# Patient Record
Sex: Male | Born: 1990 | Race: Black or African American | Hispanic: No | Marital: Single | Smoking: Never smoker
Health system: Southern US, Community
[De-identification: ages and names within clinical notes are randomized; demographics above are authoritative.]

---
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
## 2020-04-13 ENCOUNTER — Emergency Department (HOSPITAL_BASED_OUTPATIENT_CLINIC_OR_DEPARTMENT_OTHER): Payer: Managed Care, Other (non HMO)

## 2020-04-13 ENCOUNTER — Encounter (HOSPITAL_BASED_OUTPATIENT_CLINIC_OR_DEPARTMENT_OTHER): Payer: Self-pay | Admitting: *Deleted

## 2020-04-13 ENCOUNTER — Emergency Department (HOSPITAL_BASED_OUTPATIENT_CLINIC_OR_DEPARTMENT_OTHER)
Admission: EM | Admit: 2020-04-13 | Discharge: 2020-04-13 | Disposition: A | Discharge status: Home / Self Care | Payer: Managed Care, Other (non HMO) | Attending: Emergency Medicine | Admitting: Emergency Medicine

## 2020-04-13 ENCOUNTER — Other Ambulatory Visit: Payer: Self-pay

## 2020-04-13 DIAGNOSIS — J069 Acute upper respiratory infection, unspecified: Secondary | ICD-10-CM

## 2020-04-13 DIAGNOSIS — R05 Cough: Secondary | ICD-10-CM | POA: Diagnosis present

## 2020-04-13 DIAGNOSIS — Z20822 Contact with and (suspected) exposure to covid-19: Secondary | ICD-10-CM | POA: Diagnosis not present

## 2020-04-13 LAB — SARS CORONAVIRUS 2 BY RT PCR (HOSPITAL ORDER, PERFORMED IN ~~LOC~~ HOSPITAL LAB): SARS Coronavirus 2: NEGATIVE

## 2020-04-13 NOTE — ED Triage Notes (Signed)
c/o URI  symptoms x 3 days , including chills,body aches , cough

## 2020-04-13 NOTE — ED Provider Notes (Signed)
[facility] EMERGENCY DEPARTMENT Provider Note   CSN: [id number] Arrival date & time: 04/13/20  1556     History Chief Complaint  Patient presents with  . URI    Bryan Rhodes is a 29 y.o. male.  HPI 29 year old male with body aches, dry nonproductive cough, sore throat, body aches which started 2 days ago.  He has not been vaccinated for Covid, no known Covid exposures.  Denies any fevers or chills.  Denies any chest pain or shortness of breath, though he states he felt some pain in his chest when the chest x-ray came by.  He denies any shortness of breath with walking.  No abdominal pain, nausea or vomiting.  No dysuria or hematuria.  No recent tick exposures.    History reviewed. No pertinent past medical history.  There are no problems to display for this patient.   History reviewed. No pertinent surgical history.     No family history on file.  Social History   Tobacco Use  . Smoking status: Never Smoker  . Smokeless tobacco: Never Used  Substance Use Topics  . Alcohol use: Not Currently  . Drug use: Not Currently    Home Medications Prior to Admission medications   Not on File    Allergies    Patient has no known allergies.  Review of Systems   Review of Systems  Constitutional: Negative for chills and fever.  HENT: Positive for sore throat. Negative for sinus pressure, trouble swallowing and voice change.   Respiratory: Positive for cough. Negative for shortness of breath.   Cardiovascular: Negative for chest pain.  Gastrointestinal: Negative for abdominal pain, nausea and vomiting.  Genitourinary: Negative for dysuria and hematuria.  Musculoskeletal: Positive for myalgias.  Neurological: Negative for headaches.    Physical Exam Updated Vital Signs BP (!) 144/93   Pulse (!) 101   Temp 98.9 F (37.2 C)   Resp 18   Ht 5\' 6"  (1.676 m)   Wt 88 kg   SpO2 99%   BMI 31.31 kg/m   Physical Exam Vitals and nursing note reviewed.    Constitutional:      General: He is not in acute distress.    Appearance: Normal appearance. He is well-developed. He is not ill-appearing, toxic-appearing or diaphoretic.  HENT:     Head: Normocephalic and atraumatic.     Mouth/Throat:     Mouth: Mucous membranes are moist.     Pharynx: Oropharynx is clear.     Comments: Oropharynx non erythematous without exudates, uvula midline, no unilateral tonsillar swelling, tongue normal size and midline, no sublingual/submandibular swellimg, tolerating secretions well   Eyes:     Conjunctiva/sclera: Conjunctivae normal.  Cardiovascular:     Rate and Rhythm: Normal rate and regular rhythm.     Pulses: Normal pulses.     Heart sounds: Normal heart sounds. No murmur heard.   Pulmonary:     Effort: Pulmonary effort is normal. No respiratory distress.     Breath sounds: Normal breath sounds.     Comments: Lungs clear to auscultation, no rales or wheezes or rhonchi.  Equal chest rise. Abdominal:     Palpations: Abdomen is soft.     Tenderness: There is no abdominal tenderness.  Musculoskeletal:        General: Normal range of motion.     Cervical back: Normal range of motion and neck supple.  Skin:    General: Skin is warm and dry.  Neurological:  General: No focal deficit present.     Mental Status: He is alert and oriented to person, place, and time.     ED Results / Procedures / Treatments   Labs (all labs ordered are listed, but only abnormal results are displayed) Labs Reviewed  SARS CORONAVIRUS 2 BY RT PCR (HOSPITAL ORDER, PERFORMED IN [facility])    EKG None  Radiology DG Chest Portable 1 View  Result Date: 04/13/2020 CLINICAL DATA:  Cough EXAM: PORTABLE CHEST 1 VIEW COMPARISON:  None. FINDINGS: The heart size and mediastinal contours are within normal limits. Nodular opacity seen within the left perihilar region. Otherwise both lungs are clear. The visualized skeletal structures are unremarkable.  IMPRESSION: Nodular opacity in the left perihilar region which is non-specific could be due to overlap of structures or adenopathy. If further evaluation is required would recommend dedicated PA and lateral chest radiograph Electronically Signed   By: Jonna Clark M.D.   On: 04/13/2020 16:58    Procedures Procedures (including critical care time)  Medications Ordered in ED Medications - No data to display  ED Course  I have reviewed the triage vital signs and the nursing notes.  Pertinent labs & imaging results that were available during my care of the patient were reviewed by me and considered in my medical decision making (see chart for details).    MDM Rules/Calculators/A&P                         Pt CXR negative for acute infiltrate.  Patient's Covid test is negative.   Discussed that antibiotics are not indicated for viral infections. Pt will be discharged with symptomatic treatment.  Verbalizes understanding and is agreeable with plan.  Return precautions discussed.  Pt is hemodynamically stable & in NAD prior to dc.    Final Clinical Impression(s) / ED Diagnoses Final diagnoses:  Viral upper respiratory tract infection  Encounter for laboratory testing for COVID-19 virus    Rx / DC Orders ED Discharge Orders    None       Leone Brand 04/13/20 1810    Geoffery Lyons, MD 04/13/20 2358

## 2020-04-13 NOTE — Discharge Instructions (Addendum)
Your Covid test today is negative. Your chest x-ray did not show any signs of pneumonia. You likely have a viral upper respiratory infection. You may take over-the-counter ibuprofen/Tylenol, over-the-counter cold and flu medications, lots of fluids.  Return to the ER if you have worsening respiratory symptoms.

## 2020-11-29 IMAGING — DX DG CHEST 2V
2 series · 2 of 2 positions shown · non-contrast
Comparison: Radiograph same day

CLINICAL DATA: Cough

EXAM:
CHEST - 2 VIEW

[chest pa]
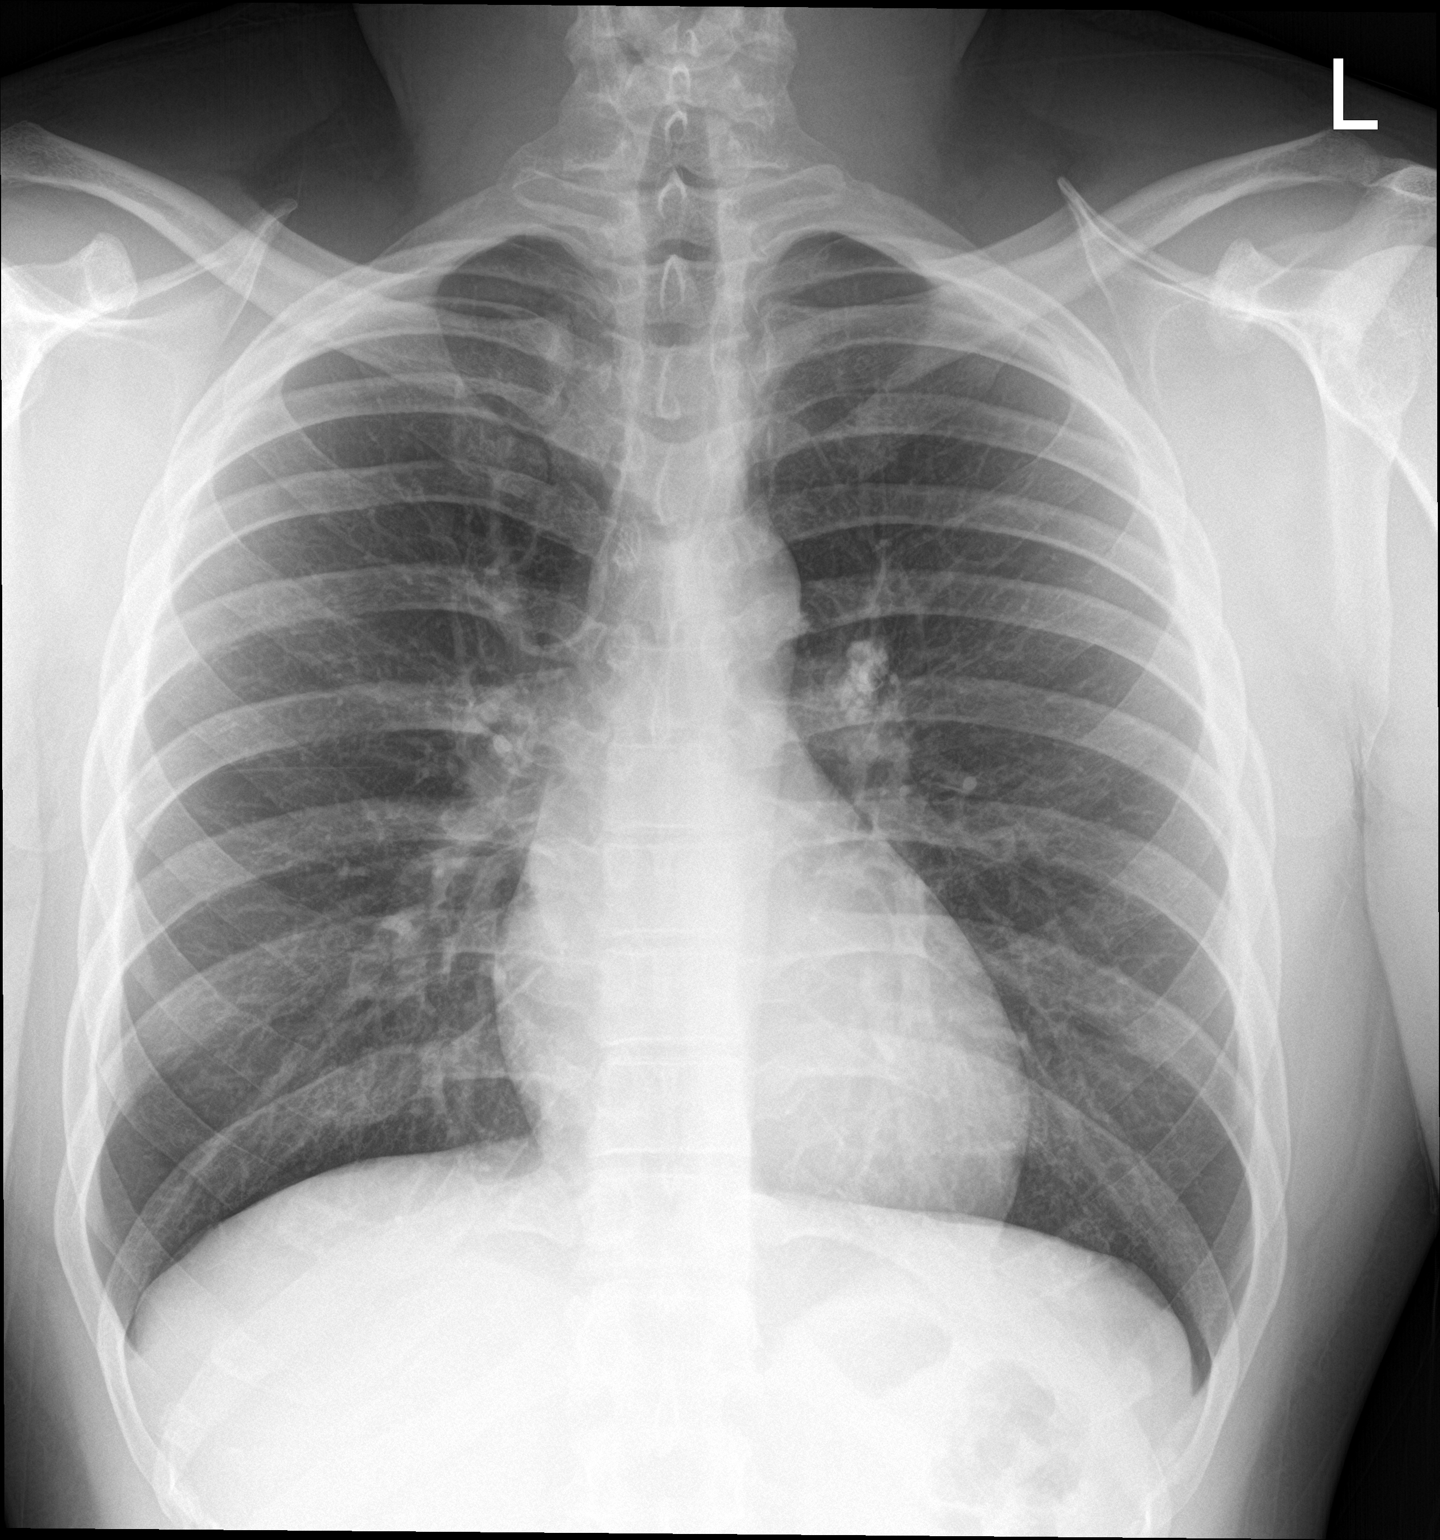

[chest lat]
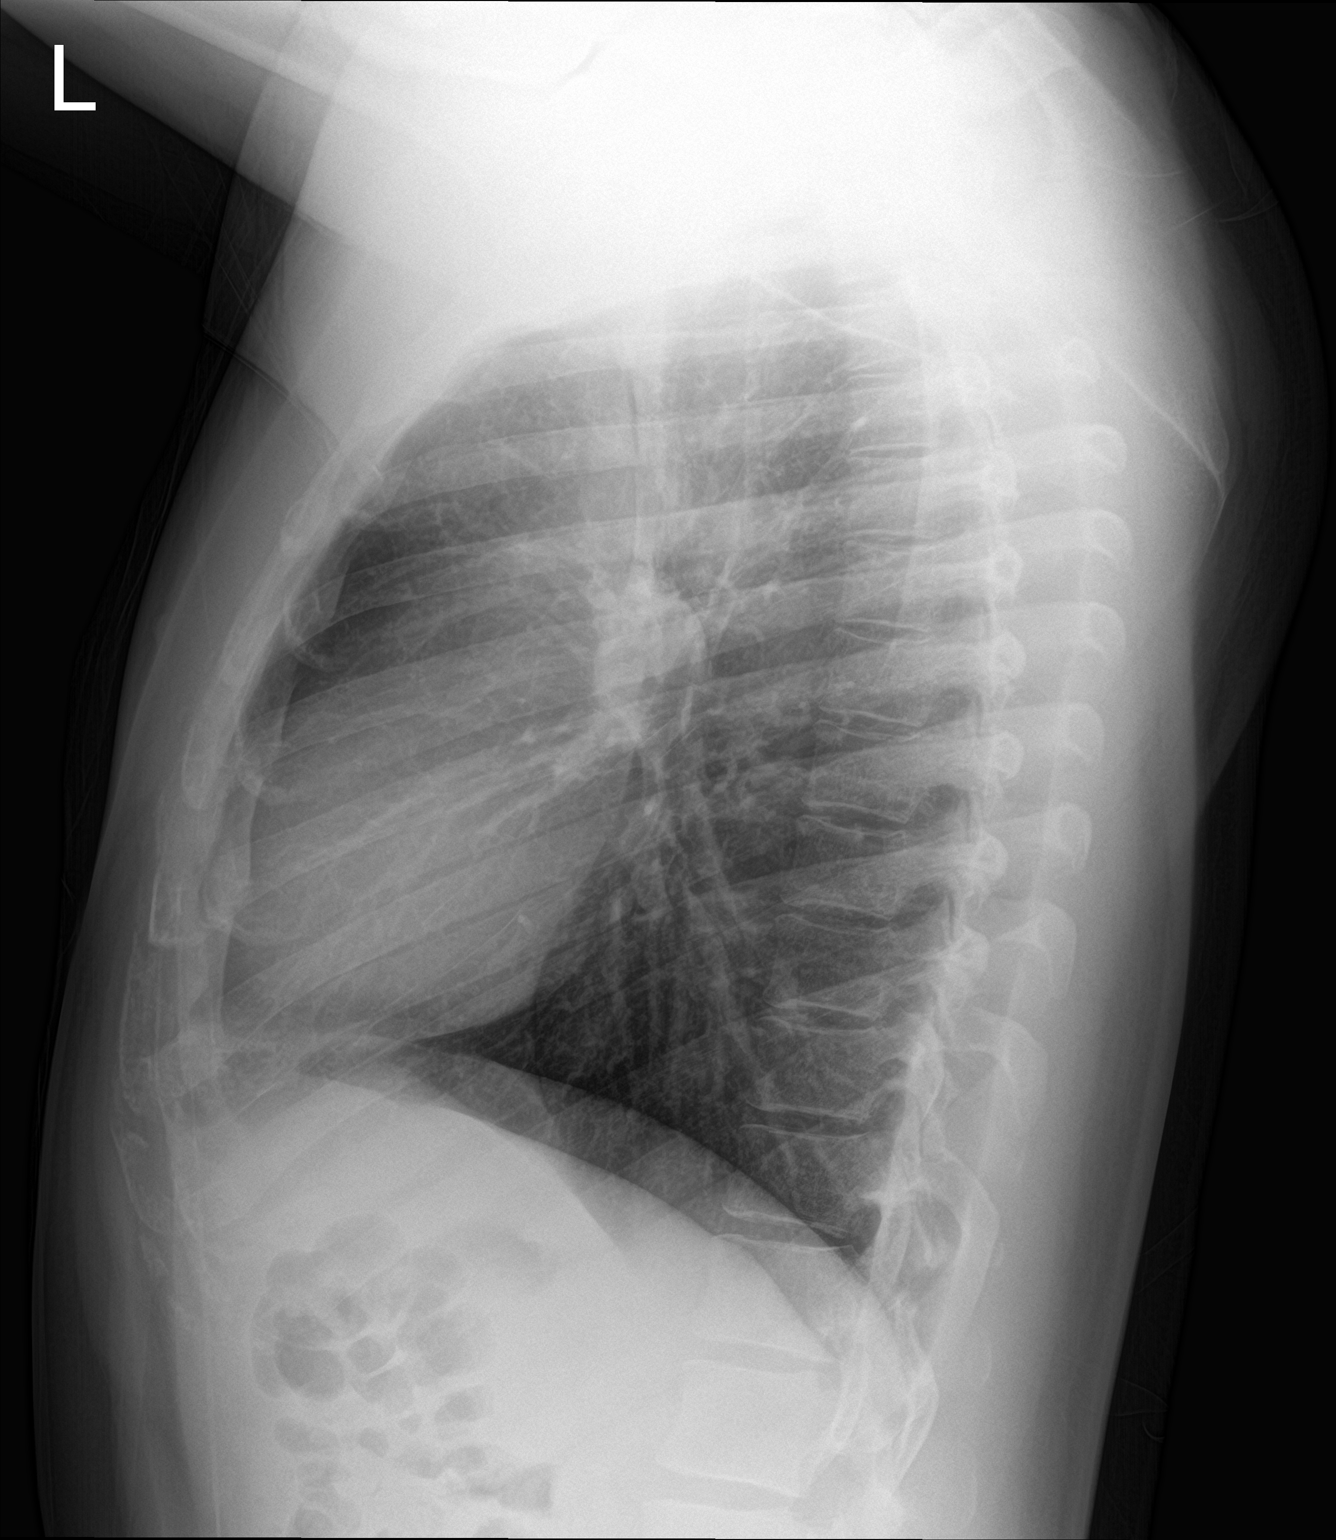

[2 of 2 positions shown; findings below may reference images not displayed]

FINDINGS: The heart size and mediastinal contours are within normal limits. On
the lateral view earlier the left perihilar nodular opacity appears
to be likely overlap of structures. The lungs are otherwise clear.
No acute osseous abnormality.
IMPRESSION: No active cardiopulmonary disease.

## 2021-01-25 ENCOUNTER — Encounter (HOSPITAL_BASED_OUTPATIENT_CLINIC_OR_DEPARTMENT_OTHER): Payer: Self-pay | Admitting: Emergency Medicine

## 2021-01-25 ENCOUNTER — Emergency Department (HOSPITAL_BASED_OUTPATIENT_CLINIC_OR_DEPARTMENT_OTHER): Payer: Self-pay

## 2021-01-25 ENCOUNTER — Other Ambulatory Visit: Payer: Self-pay

## 2021-01-25 ENCOUNTER — Emergency Department (HOSPITAL_BASED_OUTPATIENT_CLINIC_OR_DEPARTMENT_OTHER)
Admission: EM | Admit: 2021-01-25 | Discharge: 2021-01-25 | Disposition: A | Discharge status: Home / Self Care | Payer: Self-pay | Attending: Emergency Medicine | Admitting: Emergency Medicine

## 2021-01-25 DIAGNOSIS — S43006A Unspecified dislocation of unspecified shoulder joint, initial encounter: Secondary | ICD-10-CM | POA: Insufficient documentation

## 2021-01-25 DIAGNOSIS — X58XXXA Exposure to other specified factors, initial encounter: Secondary | ICD-10-CM | POA: Insufficient documentation

## 2021-01-25 NOTE — ED Triage Notes (Signed)
Pt states he dislocated his right shoulder tonight in his sleep  Pt was able to move right arm in circular motion on the way to room to be triaged  Strong radial pulse noted

## 2021-01-25 NOTE — ED Provider Notes (Signed)
[facility] Provider Note: Lowella Dell, MD, FACEP  CSN: [id number] MRN: [id number] ARRIVAL: 01/25/21 at 0421 ROOM: [facility]09/[facility]09   CHIEF COMPLAINT  Shoulder Injury   HISTORY OF PRESENT ILLNESS  01/25/21 4:34 AM Bryan Rhodes is a 30 y.o. male with a history of shoulder dislocation.  He states he dislocated his right shoulder this morning while asleep just prior to arrival.  He rated his pain as a 10 out of 10, throbbing in nature, with limited ability to move his shoulder.  In the process of being brought to his room he was able to move his arm again and he believes he may have spontaneously relocated.  Pain is now milder and is able to move his right shoulder.  He denies any numbness or weakness in his right arm distal to the shoulder.   History reviewed. No pertinent past medical history.  Past Surgical History:  Procedure Laterality Date  . SHOULDER SURGERY      History reviewed. No pertinent family history.  Social History   Tobacco Use  . Smoking status: Never Smoker  . Smokeless tobacco: Never Used  Substance Use Topics  . Alcohol use: Not Currently  . Drug use: Not Currently    Prior to Admission medications   Not on File    Allergies Patient has no known allergies.   REVIEW OF SYSTEMS  Negative except as noted here or in the History of Present Illness.   PHYSICAL EXAMINATION  Initial Vital Signs Blood pressure 134/89, pulse 63, temperature 98.1 F (36.7 C), temperature source Oral, resp. rate 14, height 5\' 7"  (1.702 m), weight 90.7 kg, SpO2 100 %.  Examination General: Well-developed, well-nourished male in no acute distress; appearance consistent with age of record HENT: normocephalic; atraumatic Eyes: Normal appearance Neck: supple Heart: regular rate and rhythm Lungs: clear to auscultation bilaterally Abdomen: soft; nondistended; nontender; bowel sounds present Extremities: No deformity; full range of motion; pulses  normal Neurologic: Awake, alert and oriented; motor function intact in all extremities and symmetric; no facial droop Skin: Warm and dry Psychiatric: Normal mood and affect   RESULTS  Summary of this visit's results, reviewed and interpreted by myself:   EKG Interpretation  Date/Time:    Ventricular Rate:    PR Interval:    QRS Duration:   QT Interval:    QTC Calculation:   R Axis:     Text Interpretation:        Laboratory Studies: No results found for this or any previous visit (from the past 24 hour(s)). Imaging Studies: DG Shoulder Right  Result Date: 01/25/2021 CLINICAL DATA:  30 year old male with right shoulder pain, transient dislocation while sleeping. EXAM: RIGHT SHOULDER - 2+ VIEW COMPARISON:  Chest radiograph 04/13/2020 and earlier. FINDINGS: Bone mineralization is within normal limits. Normal glenohumeral joint alignment. There is no evidence of fracture or dislocation. There is no evidence of arthropathy or other focal bone abnormality. Negative visible right ribs and chest. IMPRESSION: Negative. Electronically Signed   By: 04/15/2020 M.D.   On: 01/25/2021 05:13    ED COURSE and MDM  Nursing notes, initial and subsequent vitals signs, including pulse oximetry, reviewed and interpreted by myself.  Vitals:   01/25/21 0430 01/25/21 0431  BP: 134/89   Pulse: 63   Resp: 14   Temp: 98.1 F (36.7 C)   TempSrc: Oral   SpO2: 100%   Weight:  90.7 kg  Height:  5\' 7"  (1.702 m)   Medications - No data  to display  Shoulder dislocation on radiograph.  PROCEDURES  Procedures   ED DIAGNOSES     ICD-10-CM   1. Shoulder joint dislocation  S43.006A        Meshach Perry, Jonny Ruiz, MD 01/25/21 [phone]

## 2021-09-12 IMAGING — DX DG SHOULDER 2+V*R*
3 series · 3 of 3 positions shown · non-contrast
Comparison: Chest radiograph 04/13/2020 and earlier.

CLINICAL DATA: 29-year-old male with right shoulder pain, transient
dislocation while sleeping.

EXAM:
RIGHT SHOULDER - 2+ VIEW

[shoulder grashey]
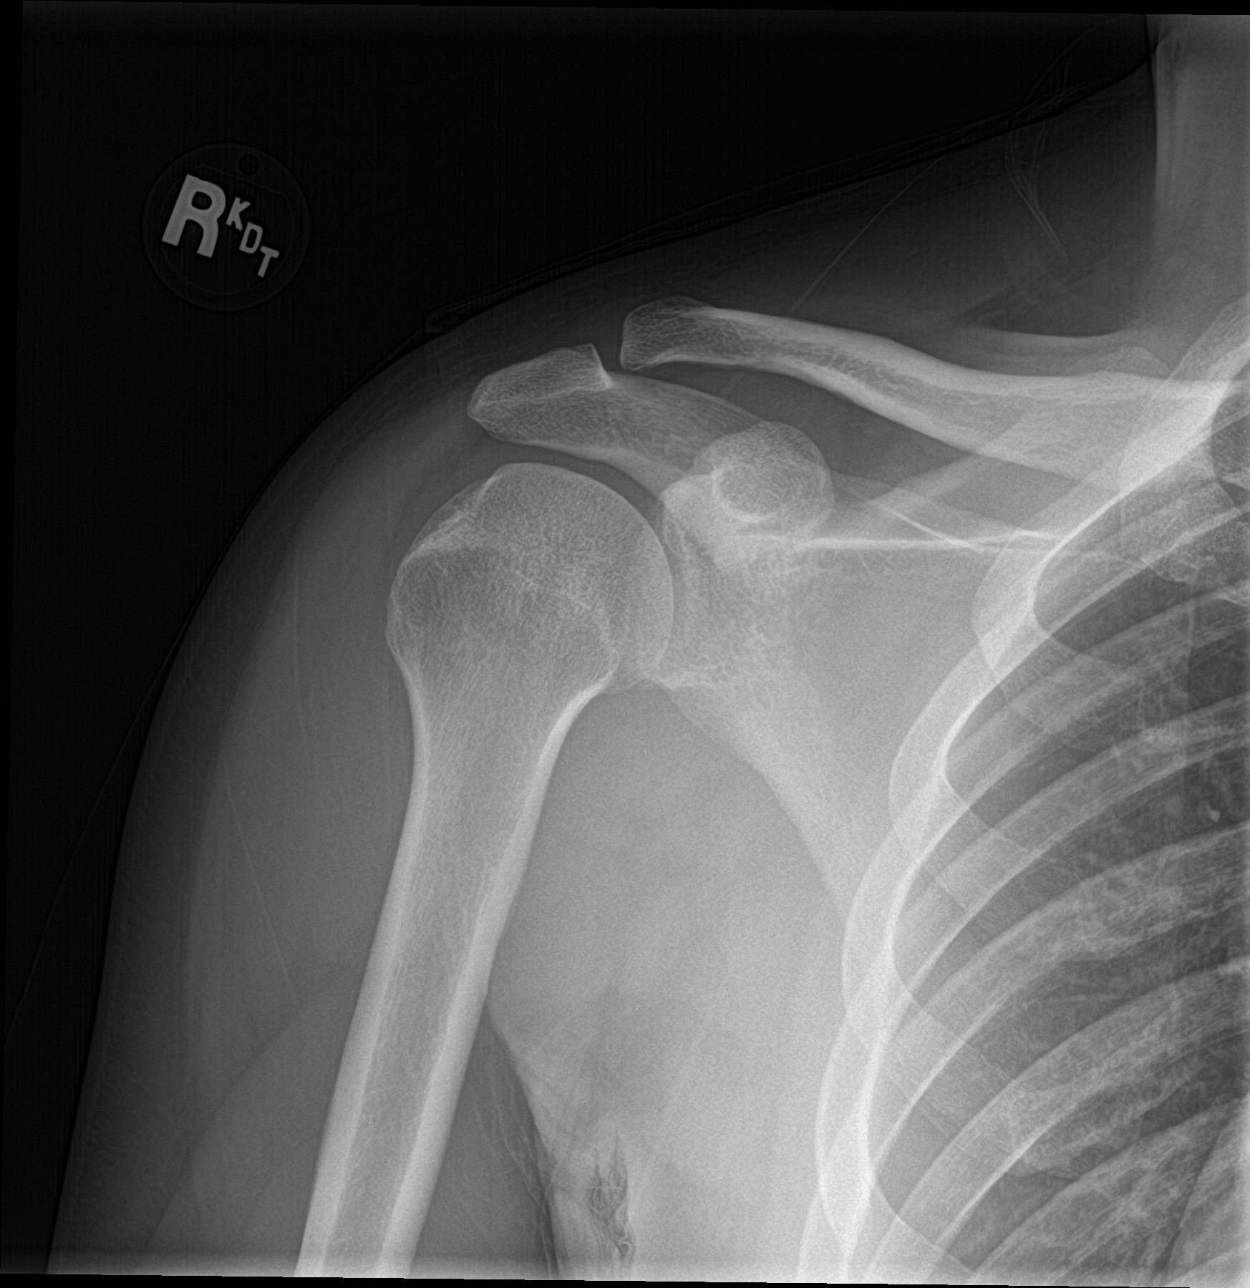

[shoulder y view]
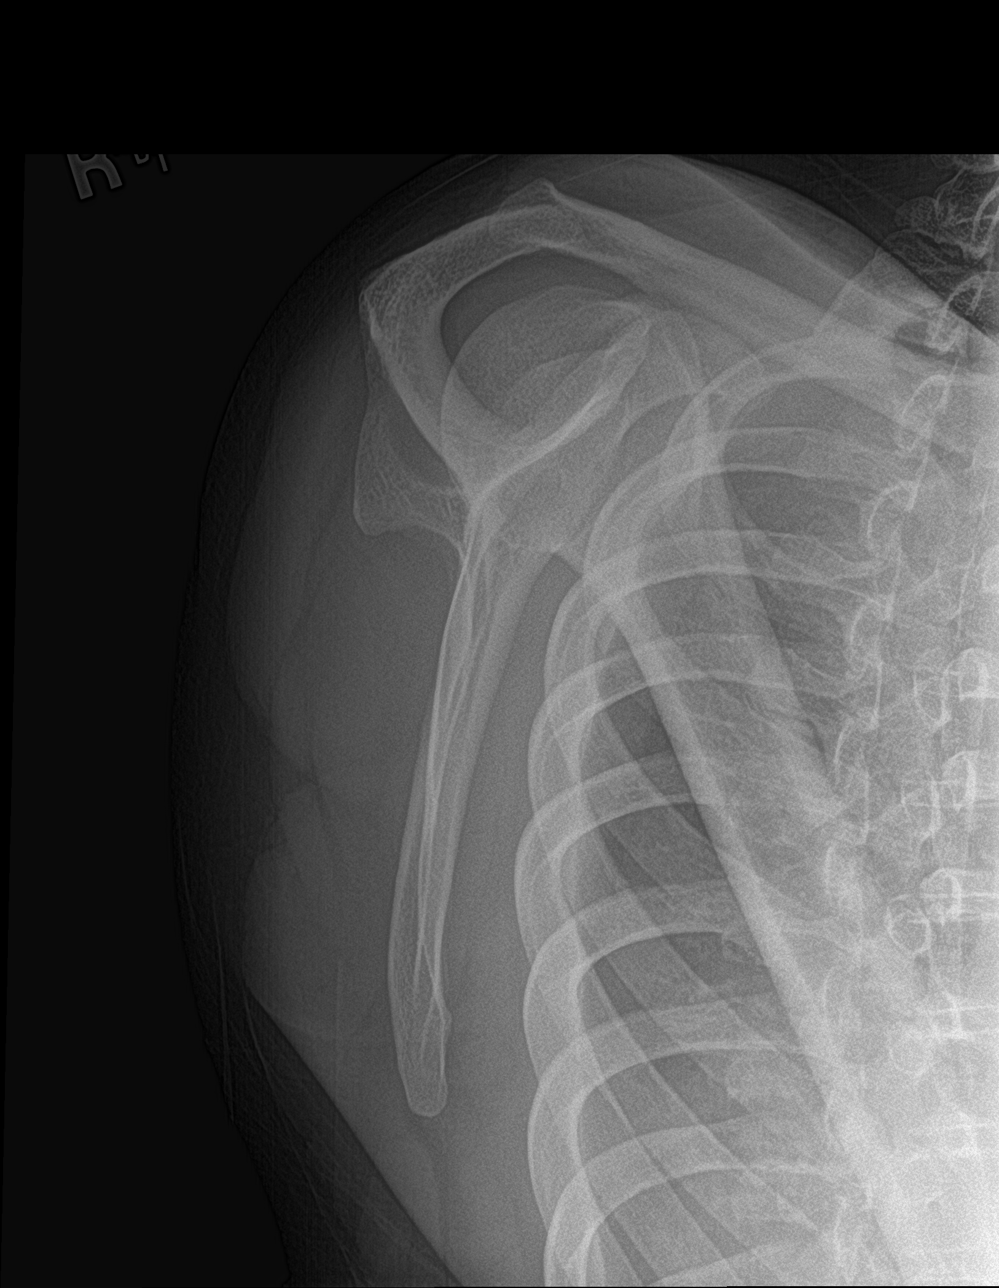

[shoulder axillary]
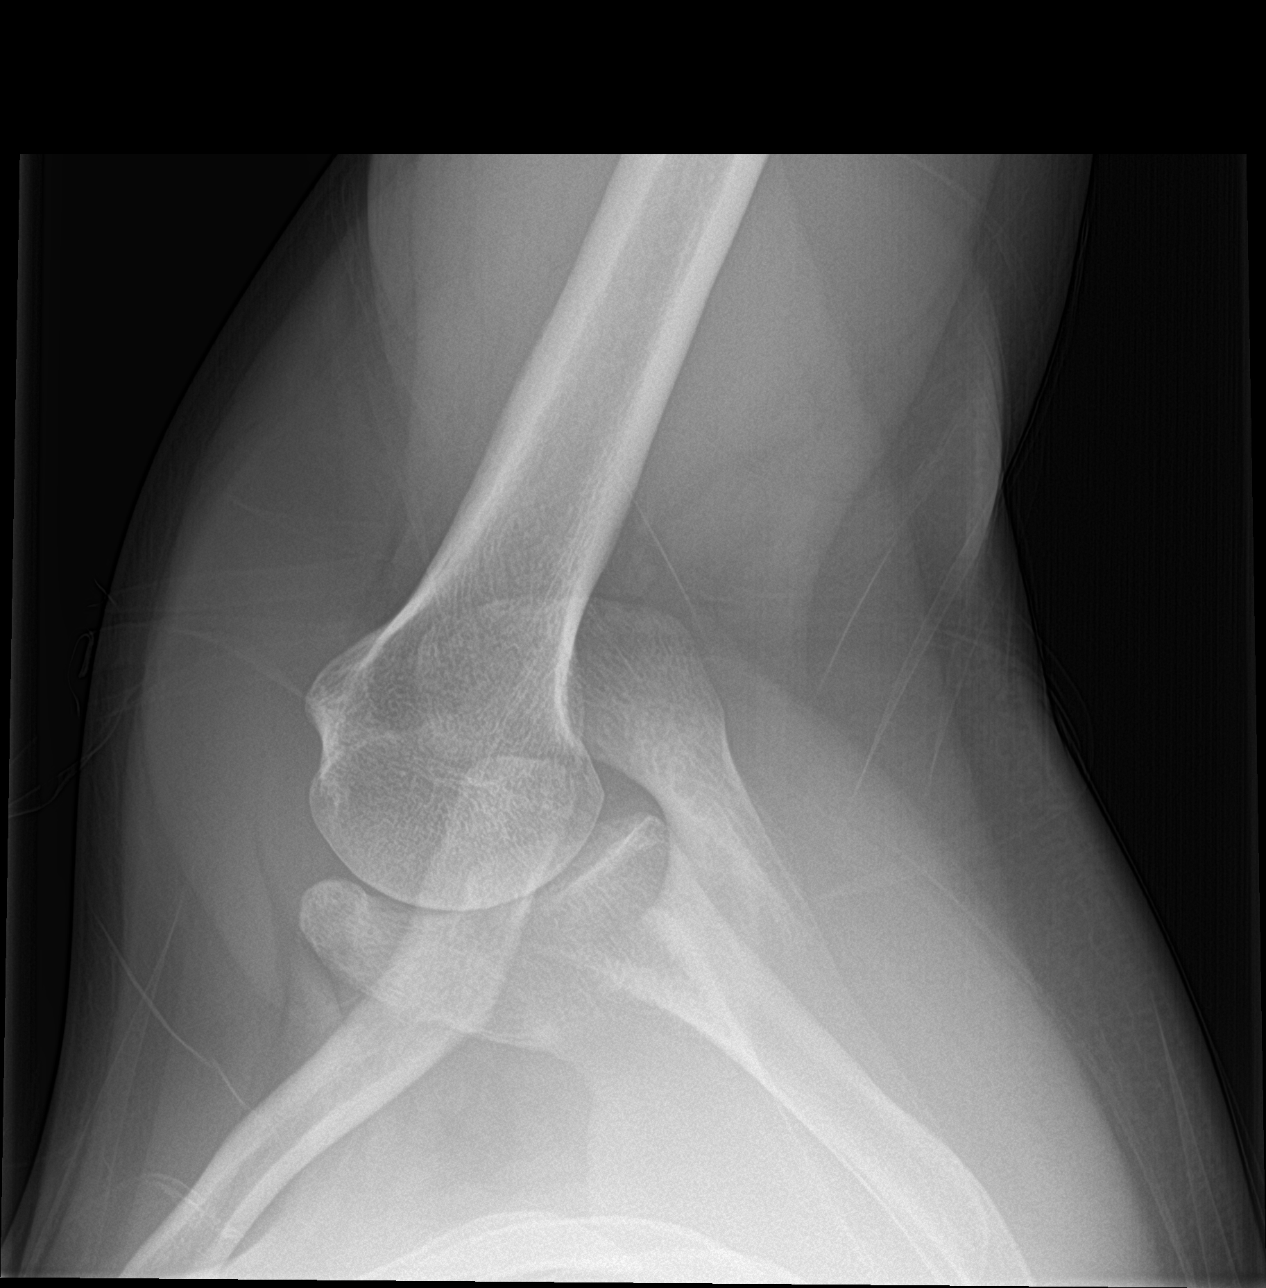

[3 of 3 positions shown; findings below may reference images not displayed]

FINDINGS: Bone mineralization is within normal limits. Normal glenohumeral
joint alignment. There is no evidence of fracture or dislocation.
There is no evidence of arthropathy or other focal bone abnormality.
Negative visible right ribs and chest.
IMPRESSION: Negative.
